# Patient Record
Sex: Male | Born: 1935 | Race: White | Hispanic: No | Marital: Married | Smoking: Former smoker
Health system: Southern US, Community
[De-identification: ages and names within clinical notes are randomized; demographics above are authoritative.]

## PROBLEM LIST (undated history)

## (undated) DIAGNOSIS — K219 Gastro-esophageal reflux disease without esophagitis: Secondary | ICD-10-CM

## (undated) DIAGNOSIS — E7800 Pure hypercholesterolemia, unspecified: Secondary | ICD-10-CM

## (undated) DIAGNOSIS — N429 Disorder of prostate, unspecified: Secondary | ICD-10-CM

## (undated) HISTORY — PX: COLON SURGERY: SHX602

## (undated) HISTORY — PX: COLOSTOMY: SHX63

## (undated) HISTORY — PX: JOINT REPLACEMENT: SHX530

---

## 2015-05-01 ENCOUNTER — Emergency Department (HOSPITAL_BASED_OUTPATIENT_CLINIC_OR_DEPARTMENT_OTHER)
Admission: EM | Admit: 2015-05-01 | Discharge: 2015-05-01 | Disposition: A | Discharge status: Home / Self Care | Payer: Medicare Other | Attending: Emergency Medicine | Admitting: Emergency Medicine

## 2015-05-01 ENCOUNTER — Emergency Department (HOSPITAL_BASED_OUTPATIENT_CLINIC_OR_DEPARTMENT_OTHER): Payer: Medicare Other

## 2015-05-01 ENCOUNTER — Encounter (HOSPITAL_BASED_OUTPATIENT_CLINIC_OR_DEPARTMENT_OTHER): Payer: Self-pay | Admitting: *Deleted

## 2015-05-01 DIAGNOSIS — Z87891 Personal history of nicotine dependence: Secondary | ICD-10-CM | POA: Insufficient documentation

## 2015-05-01 DIAGNOSIS — Z79899 Other long term (current) drug therapy: Secondary | ICD-10-CM | POA: Insufficient documentation

## 2015-05-01 DIAGNOSIS — R059 Cough, unspecified: Secondary | ICD-10-CM

## 2015-05-01 DIAGNOSIS — R05 Cough: Secondary | ICD-10-CM | POA: Diagnosis not present

## 2015-05-01 DIAGNOSIS — Z7951 Long term (current) use of inhaled steroids: Secondary | ICD-10-CM | POA: Diagnosis not present

## 2015-05-01 DIAGNOSIS — E7800 Pure hypercholesterolemia, unspecified: Secondary | ICD-10-CM | POA: Insufficient documentation

## 2015-05-01 HISTORY — DX: Pure hypercholesterolemia, unspecified: E78.00

## 2015-05-01 HISTORY — DX: Gastro-esophageal reflux disease without esophagitis: K21.9

## 2015-05-01 HISTORY — DX: Disorder of prostate, unspecified: N42.9

## 2015-05-01 MED ORDER — ALBUTEROL SULFATE HFA 108 (90 BASE) MCG/ACT IN AERS
2.0000 | INHALATION_SPRAY | RESPIRATORY_TRACT | Status: DC | PRN
  Administered 2015-05-01: 2 via RESPIRATORY_TRACT
  Filled 2015-05-01: qty 6.7

## 2015-05-01 NOTE — ED Notes (Signed)
Patient transported to X-ray 

## 2015-05-01 NOTE — ED Notes (Signed)
Pt c/o productive cough with yellow mucous and congestion x 1 week. No relief with OTC meds.

## 2015-05-01 NOTE — ED Provider Notes (Addendum)
CSN: [id number]     Arrival date & time 05/01/15  0105 History   First MD Initiated Contact with Patient 05/01/15 0256     Chief Complaint  Patient presents with  . Cough     (Consider location/radiation/quality/duration/timing/severity/associated sxs/prior Treatment) HPI This is an 82 year old male with a one-week history of cough. The cough is nonproductive. He has not had a fever, chills or shortness of breath or chest pain with it. This morning it became so severe he could not sleep. He had been taking DayQuil and NyQuil with adequate relief until this morning. It did not significantly improved with sitting up. It did improve after arrival in the ED.   Past Medical History  Diagnosis Date  . Hypercholesteremia   . Gastric reflux   . Prostate troubles    Past Surgical History  Procedure Laterality Date  . Colon surgery    . Joint replacement    . Colostomy     History reviewed. No pertinent family history. Social History  Substance Use Topics  . Smoking status: Former Games developermoker  . Smokeless tobacco: None  . Alcohol Use: No    Review of Systems  All other systems reviewed and are negative.   Allergies  Review of patient's allergies indicates no known allergies.  Home Medications   Prior to Admission medications   Medication Sig Start Date End Date Taking? Authorizing Provider  Cholecalciferol (VITAMIN D3) 2000 units TABS Take by mouth.   Yes Historical Provider, MD  finasteride (PROSCAR) 5 MG tablet Take 5 mg by mouth daily.   Yes Historical Provider, MD  ipratropium (ATROVENT) 0.03 % nasal spray Place 2 sprays into both nostrils every 12 (twelve) hours.   Yes Historical Provider, MD  omeprazole (PRILOSEC) 20 MG capsule Take 20 mg by mouth daily.   Yes Historical Provider, MD  simvastatin (ZOCOR) 40 MG tablet Take 40 mg by mouth daily.   Yes Historical Provider, MD  tamsulosin (FLOMAX) 0.4 MG CAPS capsule Take 0.4 mg by mouth.   Yes Historical Provider, MD   BP  131/72 mmHg  Pulse 72  Temp(Src) 98.4 F (36.9 C) (Oral)  Resp 20  Ht 5\' 7"  (1.702 m)  Wt 190 lb (86.183 kg)  BMI 29.75 kg/m2  SpO2 93%   Physical Exam  General: Well-developed, well-nourished male in no acute distress; appearance consistent with age of record HENT: normocephalic; atraumatic Eyes: pupils equal, round and reactive to light; extraocular muscles intact; lens implants Neck: supple Heart: regular rate and rhythm Lungs: Faint expiratory wheezes in the bases which cleared after several deep breaths Abdomen: soft; nondistended; nontender; bowel sounds present Extremities: No deformity; full range of motion; pulses normal Neurologic: Awake, alert and oriented; motor function intact in all extremities and symmetric; no facial droop Skin: Warm and dry Psychiatric: Normal mood and affect    ED Course  Procedures (including critical care time)   MDM  Nursing notes and vitals signs, including pulse oximetry, reviewed.  Summary of this visit's results, reviewed by myself:  Imaging Studies: Dg Chest 2 View  05/01/2015  CLINICAL DATA:  Productive cough. EXAM: CHEST  2 VIEW COMPARISON:  None. FINDINGS: Streaky left basilar opacity favor atelectasis. There is no edema, air bronchogram, effusion, or pneumothorax. Normal heart size and negative aortic contours. Thoracic ankylosis. IMPRESSION: Left basilar opacity favors atelectasis over pneumonia. Electronically Signed   By: Marnee SpringJonathon  Watts M.D.   On: 05/01/2015 03:24   Will give patient albuterol inhaler and teacher minute use. Suspect  seasonal allergic etiology. He is already taking Claritin.     Paula Libra, MD 05/01/15 1610  Paula Libra, MD 05/01/15 [phone]

## 2015-05-01 NOTE — ED Notes (Signed)
Returned from XR 

## 2015-05-01 NOTE — Discharge Instructions (Signed)

## 2015-05-27 ENCOUNTER — Ambulatory Visit: Payer: Self-pay | Admitting: Internal Medicine

## 2017-05-05 IMAGING — CR DG CHEST 2V
2 series · 2 of 2 positions shown · non-contrast
Comparison: None.

CLINICAL DATA: Productive cough.

EXAM:
CHEST  2 VIEW

[w chest pa]
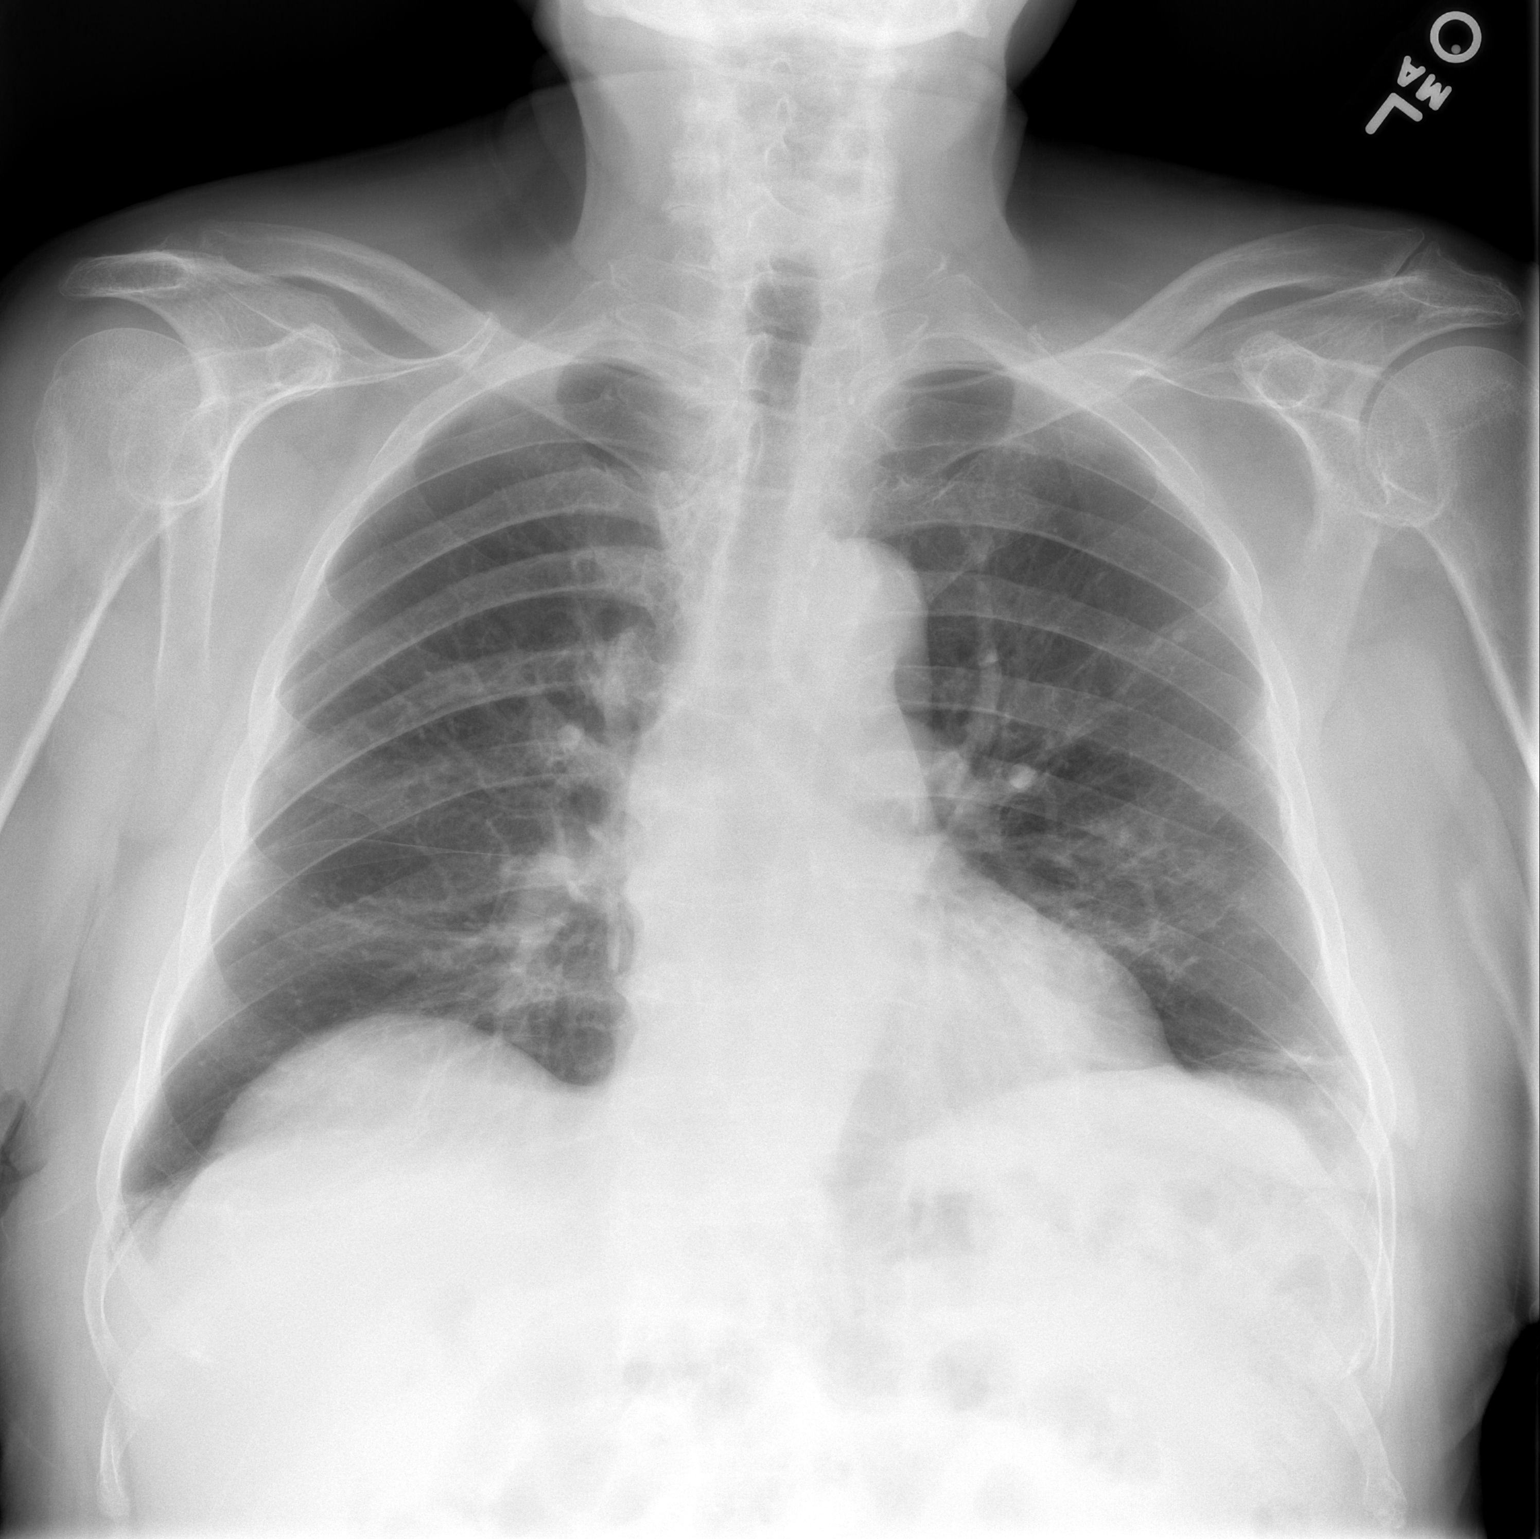

[w chest lat]
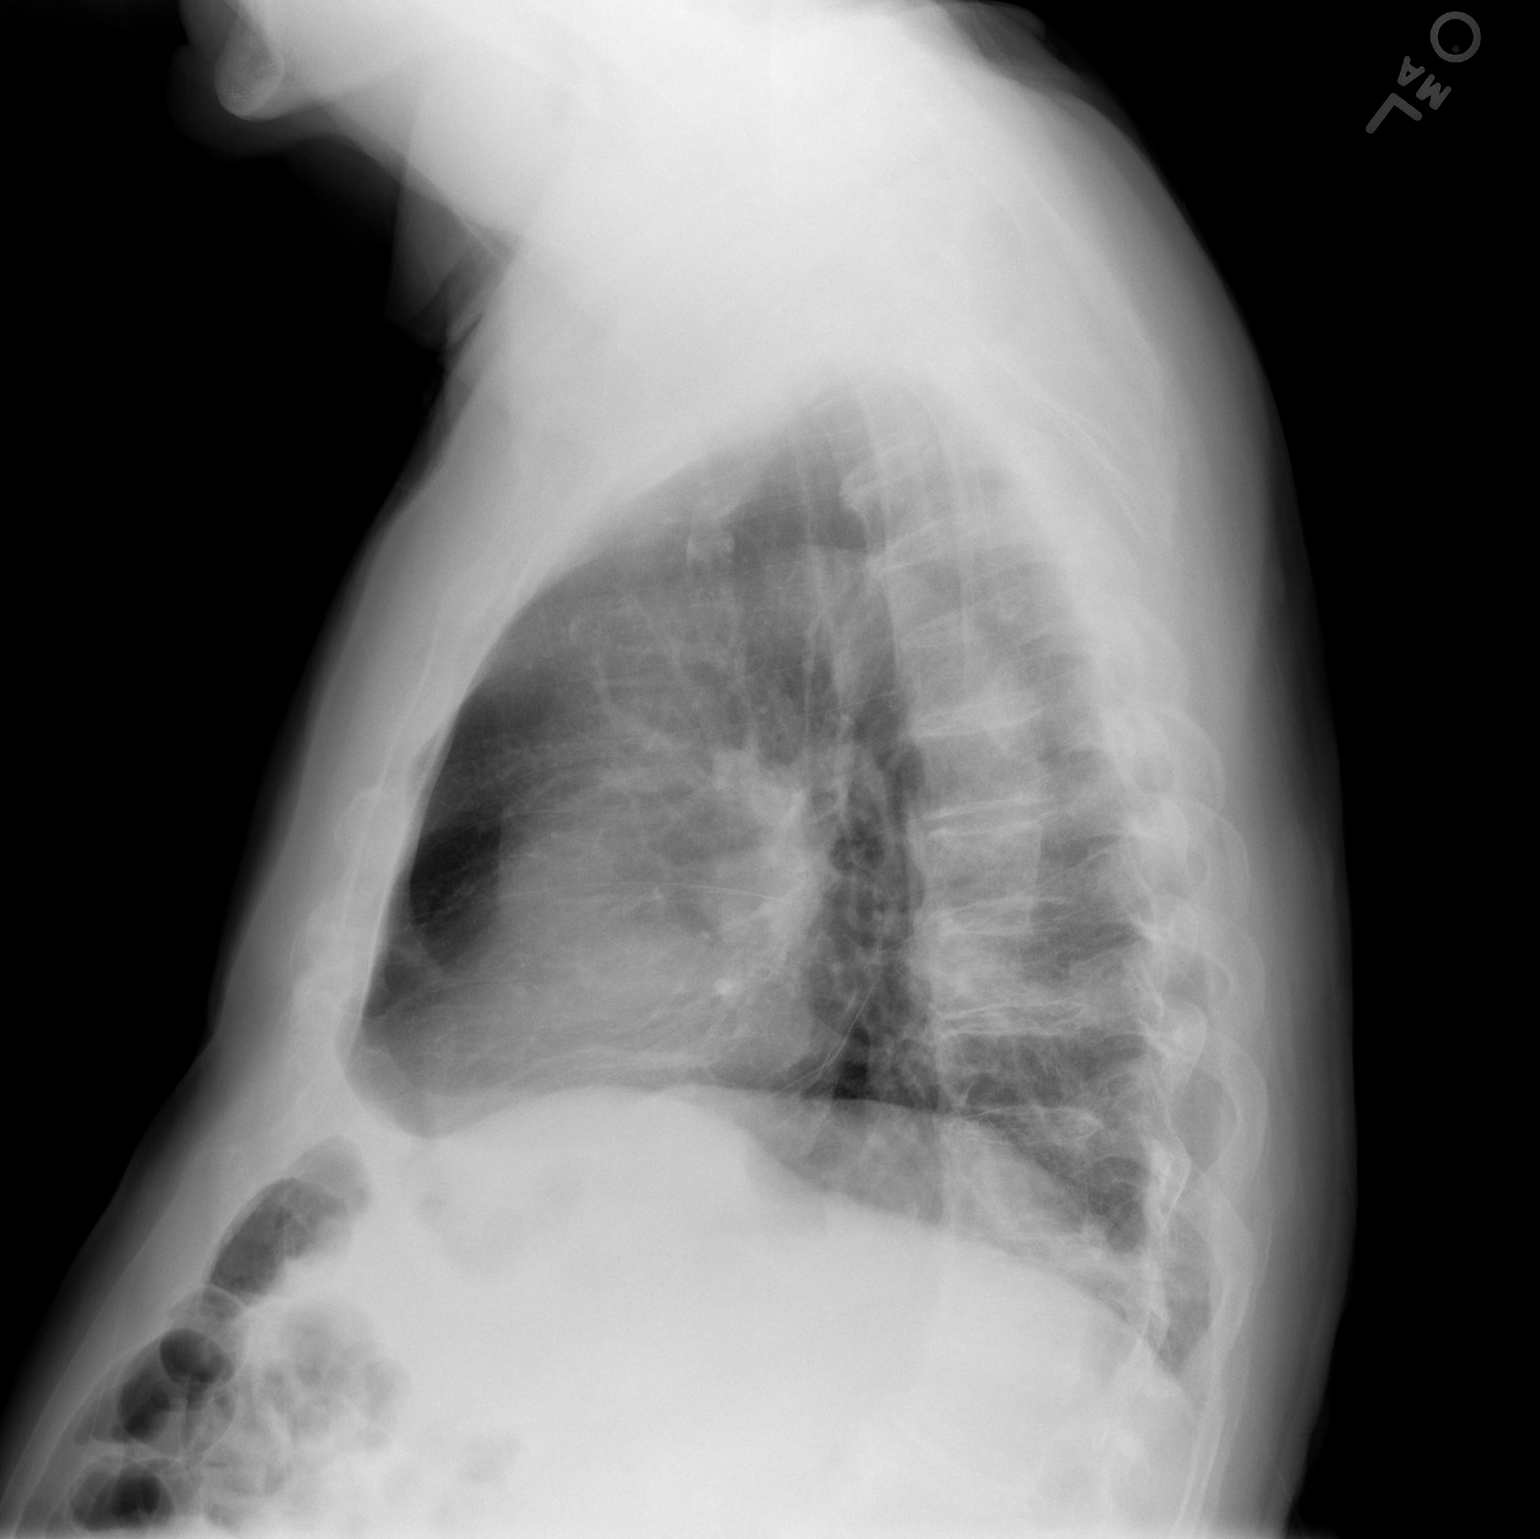

[2 of 2 positions shown; findings below may reference images not displayed]

FINDINGS: Streaky left basilar opacity favor atelectasis. There is no edema,
air bronchogram, effusion, or pneumothorax. Normal heart size and
negative aortic contours. Thoracic ankylosis.
IMPRESSION: Left basilar opacity favors atelectasis over pneumonia.

## 2020-09-01 DEATH — deceased
